# Patient Record
Sex: Female | Born: 1995 | Race: White | Hispanic: No | Marital: Single | Smoking: Never smoker
Health system: Southern US, Community
[De-identification: ages and names within clinical notes are randomized; demographics above are authoritative.]

## PROBLEM LIST (undated history)

## (undated) HISTORY — PX: APPENDECTOMY: SHX54

---
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
## 2016-10-30 ENCOUNTER — Encounter (HOSPITAL_COMMUNITY): Payer: Self-pay | Admitting: Emergency Medicine

## 2016-10-30 DIAGNOSIS — R55 Syncope and collapse: Secondary | ICD-10-CM | POA: Diagnosis not present

## 2016-10-30 DIAGNOSIS — N83201 Unspecified ovarian cyst, right side: Secondary | ICD-10-CM | POA: Insufficient documentation

## 2016-10-30 DIAGNOSIS — K353 Acute appendicitis with localized peritonitis: Secondary | ICD-10-CM | POA: Diagnosis not present

## 2016-10-30 DIAGNOSIS — R109 Unspecified abdominal pain: Secondary | ICD-10-CM | POA: Diagnosis present

## 2016-10-30 LAB — CBC
HCT: 35.6 % — ABNORMAL LOW (ref 36.0–46.0)
HEMOGLOBIN: 12 g/dL (ref 12.0–15.0)
MCH: 29.6 pg (ref 26.0–34.0)
MCHC: 33.7 g/dL (ref 30.0–36.0)
MCV: 87.9 fL (ref 78.0–100.0)
PLATELETS: 299 10*3/uL (ref 150–400)
RBC: 4.05 MIL/uL (ref 3.87–5.11)
RDW: 12.3 % (ref 11.5–15.5)
WBC: 17 10*3/uL — ABNORMAL HIGH (ref 4.0–10.5)

## 2016-10-30 NOTE — ED Triage Notes (Signed)
Pt c/o lower abd pain 10/10 with nausea and vomiting, pt states when pain gets very sharp she has some syncope episodes with the pain. No fever or chills.

## 2016-10-31 ENCOUNTER — Emergency Department (HOSPITAL_COMMUNITY): Payer: 59 | Admitting: Anesthesiology

## 2016-10-31 ENCOUNTER — Observation Stay (HOSPITAL_COMMUNITY)
Admission: EM | Admit: 2016-10-31 | Discharge: 2016-11-01 | Disposition: A | Discharge status: Home / Self Care | Payer: 59 | Attending: General Surgery | Admitting: General Surgery

## 2016-10-31 ENCOUNTER — Encounter (HOSPITAL_COMMUNITY): Payer: Self-pay | Admitting: Radiology

## 2016-10-31 ENCOUNTER — Encounter (HOSPITAL_COMMUNITY)
Admission: EM | Disposition: A | Discharge status: Home / Self Care | Payer: Self-pay | Source: Home / Self Care | Attending: Emergency Medicine

## 2016-10-31 ENCOUNTER — Emergency Department (HOSPITAL_COMMUNITY): Payer: 59

## 2016-10-31 DIAGNOSIS — K3533 Acute appendicitis with perforation and localized peritonitis, with abscess: Secondary | ICD-10-CM | POA: Diagnosis present

## 2016-10-31 DIAGNOSIS — N83209 Unspecified ovarian cyst, unspecified side: Secondary | ICD-10-CM

## 2016-10-31 DIAGNOSIS — K353 Acute appendicitis with localized peritonitis, without perforation or gangrene: Secondary | ICD-10-CM

## 2016-10-31 HISTORY — PX: LAPAROSCOPIC APPENDECTOMY: SHX408

## 2016-10-31 LAB — COMPREHENSIVE METABOLIC PANEL
ALK PHOS: 44 U/L (ref 38–126)
ALT: 19 U/L (ref 14–54)
AST: 25 U/L (ref 15–41)
Albumin: 4.3 g/dL (ref 3.5–5.0)
Anion gap: 10 (ref 5–15)
BUN: 14 mg/dL (ref 6–20)
CALCIUM: 9 mg/dL (ref 8.9–10.3)
CHLORIDE: 104 mmol/L (ref 101–111)
CO2: 22 mmol/L (ref 22–32)
CREATININE: 0.75 mg/dL (ref 0.44–1.00)
GFR calc Af Amer: >60 mL/min (ref >60)
GFR calc non Af Amer: >60 mL/min (ref >60)
GLUCOSE: 132 mg/dL — AB (ref 65–99)
Potassium: 3.4 mmol/L — ABNORMAL LOW (ref 3.5–5.1)
SODIUM: 136 mmol/L (ref 135–145)
Total Bilirubin: 0.4 mg/dL (ref 0.3–1.2)
Total Protein: 7 g/dL (ref 6.5–8.1)

## 2016-10-31 LAB — URINALYSIS, ROUTINE W REFLEX MICROSCOPIC
BACTERIA UA: NONE SEEN
Bilirubin Urine: NEGATIVE
GLUCOSE, UA: NEGATIVE mg/dL
HGB URINE DIPSTICK: NEGATIVE
Ketones, ur: 5 mg/dL — AB
Leukocytes, UA: NEGATIVE
Nitrite: NEGATIVE
PROTEIN: 30 mg/dL — AB
Specific Gravity, Urine: 1.031 — ABNORMAL HIGH (ref 1.005–1.030)
pH: 5 (ref 5.0–8.0)

## 2016-10-31 LAB — PREGNANCY, URINE: PREG TEST UR: NEGATIVE

## 2016-10-31 LAB — LIPASE, BLOOD: LIPASE: 28 U/L (ref 11–51)

## 2016-10-31 SURGERY — APPENDECTOMY, LAPAROSCOPIC
Anesthesia: General | Site: Abdomen

## 2016-10-31 MED ORDER — IOPAMIDOL (ISOVUE-300) INJECTION 61%
INTRAVENOUS | Status: AC
  Administered 2016-10-31: 100 mL
  Filled 2016-10-31: qty 30

## 2016-10-31 MED ORDER — HYDROMORPHONE HCL 1 MG/ML IJ SOLN
0.5000 mg | INTRAMUSCULAR | Status: DC | PRN
  Administered 2016-10-31: 1 mg via INTRAVENOUS

## 2016-10-31 MED ORDER — PROPOFOL 10 MG/ML IV BOLUS
INTRAVENOUS | Status: DC | PRN
  Administered 2016-10-31: 150 mg via INTRAVENOUS

## 2016-10-31 MED ORDER — SODIUM CHLORIDE 0.9 % IV SOLN
INTRAVENOUS | Status: DC | PRN
  Administered 2016-10-31: 10:00:00 via INTRAVENOUS

## 2016-10-31 MED ORDER — FENTANYL CITRATE (PF) 100 MCG/2ML IJ SOLN
25.0000 ug | INTRAMUSCULAR | Status: DC | PRN
  Administered 2016-10-31: 25 ug via INTRAVENOUS

## 2016-10-31 MED ORDER — SODIUM CHLORIDE 0.9 % IV BOLUS (SEPSIS)
1000.0000 mL | Freq: Once | INTRAVENOUS | Status: AC
  Administered 2016-10-31: 1000 mL via INTRAVENOUS

## 2016-10-31 MED ORDER — MORPHINE SULFATE (PF) 4 MG/ML IV SOLN
4.0000 mg | Freq: Once | INTRAVENOUS | Status: AC
  Administered 2016-10-31: 4 mg via INTRAVENOUS
  Filled 2016-10-31: qty 1

## 2016-10-31 MED ORDER — PROPOFOL 10 MG/ML IV BOLUS
INTRAVENOUS | Status: AC
  Filled 2016-10-31: qty 40

## 2016-10-31 MED ORDER — ONDANSETRON HCL 4 MG/2ML IJ SOLN
INTRAMUSCULAR | Status: DC | PRN
  Administered 2016-10-31: 4 mg via INTRAVENOUS

## 2016-10-31 MED ORDER — ONDANSETRON HCL 4 MG/2ML IJ SOLN
4.0000 mg | Freq: Four times a day (QID) | INTRAMUSCULAR | Status: DC | PRN
  Administered 2016-10-31: 4 mg via INTRAVENOUS
  Filled 2016-10-31: qty 2

## 2016-10-31 MED ORDER — HYDROMORPHONE HCL 1 MG/ML IJ SOLN
0.5000 mg | INTRAMUSCULAR | Status: DC | PRN
  Filled 2016-10-31: qty 1

## 2016-10-31 MED ORDER — LIDOCAINE 2% (20 MG/ML) 5 ML SYRINGE
INTRAMUSCULAR | Status: AC
  Filled 2016-10-31: qty 5

## 2016-10-31 MED ORDER — MIDAZOLAM HCL 2 MG/2ML IJ SOLN
INTRAMUSCULAR | Status: AC
  Filled 2016-10-31: qty 2

## 2016-10-31 MED ORDER — SUCCINYLCHOLINE CHLORIDE 200 MG/10ML IV SOSY
PREFILLED_SYRINGE | INTRAVENOUS | Status: AC
  Filled 2016-10-31: qty 10

## 2016-10-31 MED ORDER — KCL IN DEXTROSE-NACL 10-5-0.45 MEQ/L-%-% IV SOLN
INTRAVENOUS | Status: DC
  Administered 2016-10-31: 21:00:00 via INTRAVENOUS
  Filled 2016-10-31 (×4): qty 1000

## 2016-10-31 MED ORDER — ACETAMINOPHEN 650 MG RE SUPP: 650.0000 mg | Freq: Four times a day (QID) | RECTAL | Status: DC | PRN

## 2016-10-31 MED ORDER — SUGAMMADEX SODIUM 200 MG/2ML IV SOLN
INTRAVENOUS | Status: AC
  Filled 2016-10-31: qty 2

## 2016-10-31 MED ORDER — SUCCINYLCHOLINE CHLORIDE 20 MG/ML IJ SOLN
INTRAMUSCULAR | Status: DC | PRN
  Administered 2016-10-31: 100 mg via INTRAVENOUS

## 2016-10-31 MED ORDER — FENTANYL CITRATE (PF) 100 MCG/2ML IJ SOLN
INTRAMUSCULAR | Status: DC | PRN
  Administered 2016-10-31 (×5): 50 ug via INTRAVENOUS

## 2016-10-31 MED ORDER — FENTANYL CITRATE (PF) 250 MCG/5ML IJ SOLN
INTRAMUSCULAR | Status: AC
  Filled 2016-10-31: qty 5

## 2016-10-31 MED ORDER — ARTIFICIAL TEARS OPHTHALMIC OINT
TOPICAL_OINTMENT | OPHTHALMIC | Status: DC | PRN
  Administered 2016-10-31: 1 via OPHTHALMIC

## 2016-10-31 MED ORDER — ONDANSETRON HCL 4 MG/2ML IJ SOLN
4.0000 mg | Freq: Once | INTRAMUSCULAR | Status: AC
  Administered 2016-10-31: 4 mg via INTRAVENOUS
  Filled 2016-10-31: qty 2

## 2016-10-31 MED ORDER — ENOXAPARIN SODIUM 40 MG/0.4ML ~~LOC~~ SOLN
40.0000 mg | SUBCUTANEOUS | Status: DC
  Administered 2016-11-01: 40 mg via SUBCUTANEOUS
  Filled 2016-10-31: qty 0.4

## 2016-10-31 MED ORDER — BUPIVACAINE-EPINEPHRINE (PF) 0.25% -1:200000 IJ SOLN
INTRAMUSCULAR | Status: AC
  Filled 2016-10-31: qty 30

## 2016-10-31 MED ORDER — OXYCODONE HCL 5 MG PO TABS
5.0000 mg | ORAL_TABLET | ORAL | Status: DC | PRN
  Administered 2016-10-31: 5 mg via ORAL
  Administered 2016-11-01 (×2): 10 mg via ORAL
  Filled 2016-10-31: qty 2
  Filled 2016-10-31: qty 1
  Filled 2016-10-31: qty 2

## 2016-10-31 MED ORDER — HYDRALAZINE HCL 20 MG/ML IJ SOLN: 10.0000 mg | INTRAMUSCULAR | Status: DC | PRN

## 2016-10-31 MED ORDER — LIDOCAINE HCL (CARDIAC) 20 MG/ML IV SOLN
INTRAVENOUS | Status: DC | PRN
  Administered 2016-10-31: 100 mg via INTRAVENOUS

## 2016-10-31 MED ORDER — ROCURONIUM BROMIDE 10 MG/ML (PF) SYRINGE
PREFILLED_SYRINGE | INTRAVENOUS | Status: AC
  Filled 2016-10-31: qty 5

## 2016-10-31 MED ORDER — SODIUM CHLORIDE 0.9 % IR SOLN
Status: DC | PRN
  Administered 2016-10-31: 1000 mL

## 2016-10-31 MED ORDER — LACTATED RINGERS IV BOLUS (SEPSIS)
500.0000 mL | Freq: Once | INTRAVENOUS | Status: AC
  Administered 2016-10-31: 500 mL via INTRAVENOUS

## 2016-10-31 MED ORDER — ARTIFICIAL TEARS OPHTHALMIC OINT
TOPICAL_OINTMENT | OPHTHALMIC | Status: AC
  Filled 2016-10-31: qty 3.5

## 2016-10-31 MED ORDER — PIPERACILLIN-TAZOBACTAM 3.375 G IVPB 30 MIN
3.3750 g | Freq: Four times a day (QID) | INTRAVENOUS | Status: DC
  Administered 2016-10-31: 3.375 g via INTRAVENOUS
  Filled 2016-10-31: qty 50

## 2016-10-31 MED ORDER — FENTANYL CITRATE (PF) 100 MCG/2ML IJ SOLN
INTRAMUSCULAR | Status: AC
  Administered 2016-10-31: 25 ug via INTRAVENOUS
  Filled 2016-10-31: qty 2

## 2016-10-31 MED ORDER — ONDANSETRON 4 MG PO TBDP: 4.0000 mg | ORAL_TABLET | Freq: Four times a day (QID) | ORAL | Status: DC | PRN

## 2016-10-31 MED ORDER — ONDANSETRON HCL 4 MG/2ML IJ SOLN
INTRAMUSCULAR | Status: AC
  Filled 2016-10-31: qty 2

## 2016-10-31 MED ORDER — DEXAMETHASONE SODIUM PHOSPHATE 10 MG/ML IJ SOLN
INTRAMUSCULAR | Status: DC | PRN
  Administered 2016-10-31: 10 mg via INTRAVENOUS

## 2016-10-31 MED ORDER — BUPIVACAINE-EPINEPHRINE 0.25% -1:200000 IJ SOLN
INTRAMUSCULAR | Status: DC | PRN
  Administered 2016-10-31: 7 mL

## 2016-10-31 MED ORDER — ACETAMINOPHEN 325 MG PO TABS
650.0000 mg | ORAL_TABLET | Freq: Four times a day (QID) | ORAL | Status: DC | PRN
  Administered 2016-10-31 – 2016-11-01 (×2): 650 mg via ORAL
  Filled 2016-10-31 (×2): qty 2

## 2016-10-31 MED ORDER — IOPAMIDOL (ISOVUE-300) INJECTION 61%
INTRAVENOUS | Status: AC
  Filled 2016-10-31: qty 100

## 2016-10-31 MED ORDER — LACTATED RINGERS IV SOLN
INTRAVENOUS | Status: DC | PRN
  Administered 2016-10-31: 11:00:00 via INTRAVENOUS

## 2016-10-31 MED ORDER — SUGAMMADEX SODIUM 200 MG/2ML IV SOLN
INTRAVENOUS | Status: DC | PRN
Start: 2016-10-31 — End: 2016-10-31
  Administered 2016-10-31: 200 mg via INTRAVENOUS

## 2016-10-31 MED ORDER — 0.9 % SODIUM CHLORIDE (POUR BTL) OPTIME
TOPICAL | Status: DC | PRN
  Administered 2016-10-31: 1000 mL

## 2016-10-31 MED ORDER — PIPERACILLIN-TAZOBACTAM 3.375 G IVPB 30 MIN
3.3750 g | Freq: Once | INTRAVENOUS | Status: AC
  Administered 2016-10-31: 3.375 g via INTRAVENOUS
  Filled 2016-10-31: qty 50

## 2016-10-31 MED ORDER — MIDAZOLAM HCL 5 MG/5ML IJ SOLN
INTRAMUSCULAR | Status: DC | PRN
  Administered 2016-10-31: 2 mg via INTRAVENOUS

## 2016-10-31 MED ORDER — OXYCODONE HCL 5 MG PO TABS: 5.0000 mg | ORAL_TABLET | ORAL | Status: DC | PRN

## 2016-10-31 MED ORDER — PIPERACILLIN-TAZOBACTAM 3.375 G IVPB
3.3750 g | Freq: Three times a day (TID) | INTRAVENOUS | Status: DC
  Administered 2016-10-31 – 2016-11-01 (×3): 3.375 g via INTRAVENOUS
  Filled 2016-10-31 (×4): qty 50

## 2016-10-31 MED ORDER — DEXAMETHASONE SODIUM PHOSPHATE 10 MG/ML IJ SOLN
INTRAMUSCULAR | Status: AC
  Filled 2016-10-31: qty 1

## 2016-10-31 MED ORDER — ROCURONIUM BROMIDE 100 MG/10ML IV SOLN
INTRAVENOUS | Status: DC | PRN
  Administered 2016-10-31: 30 mg via INTRAVENOUS

## 2016-10-31 MED ORDER — PHENYLEPHRINE 40 MCG/ML (10ML) SYRINGE FOR IV PUSH (FOR BLOOD PRESSURE SUPPORT)
PREFILLED_SYRINGE | INTRAVENOUS | Status: AC
  Filled 2016-10-31: qty 10

## 2016-10-31 MED ORDER — DOCUSATE SODIUM 100 MG PO CAPS
100.0000 mg | ORAL_CAPSULE | Freq: Two times a day (BID) | ORAL | Status: DC
  Administered 2016-10-31 – 2016-11-01 (×2): 100 mg via ORAL
  Filled 2016-10-31 (×2): qty 1

## 2016-10-31 SURGICAL SUPPLY — 39 items
APPLIER CLIP ROT 10 11.4 M/L (STAPLE)
BLADE CLIPPER SURG (BLADE) IMPLANT
CANISTER SUCT 3000ML PPV (MISCELLANEOUS) ×2 IMPLANT
CHLORAPREP W/TINT 26ML (MISCELLANEOUS) ×2 IMPLANT
CLIP APPLIE ROT 10 11.4 M/L (STAPLE) IMPLANT
COVER SURGICAL LIGHT HANDLE (MISCELLANEOUS) ×2 IMPLANT
CUTTER FLEX LINEAR 45M (STAPLE) ×2 IMPLANT
DERMABOND ADVANCED (GAUZE/BANDAGES/DRESSINGS) ×1
DERMABOND ADVANCED .7 DNX12 (GAUZE/BANDAGES/DRESSINGS) ×1 IMPLANT
ELECT REM PT RETURN 9FT ADLT (ELECTROSURGICAL) ×2
ELECTRODE REM PT RTRN 9FT ADLT (ELECTROSURGICAL) ×1 IMPLANT
GLOVE BIO SURGEON STRL SZ8 (GLOVE) ×2 IMPLANT
GLOVE BIOGEL PI IND STRL 8 (GLOVE) ×1 IMPLANT
GLOVE BIOGEL PI INDICATOR 8 (GLOVE) ×1
GOWN STRL REUS W/ TWL LRG LVL3 (GOWN DISPOSABLE) IMPLANT
GOWN STRL REUS W/ TWL XL LVL3 (GOWN DISPOSABLE) ×2 IMPLANT
GOWN STRL REUS W/TWL LRG LVL3 (GOWN DISPOSABLE)
GOWN STRL REUS W/TWL XL LVL3 (GOWN DISPOSABLE) ×2
KIT BASIN OR (CUSTOM PROCEDURE TRAY) ×2 IMPLANT
KIT ROOM TURNOVER OR (KITS) ×2 IMPLANT
NEEDLE 22X1 1/2 (OR ONLY) (NEEDLE) ×2 IMPLANT
NS IRRIG 1000ML POUR BTL (IV SOLUTION) ×2 IMPLANT
PAD ARMBOARD 7.5X6 YLW CONV (MISCELLANEOUS) ×4 IMPLANT
POUCH SPECIMEN RETRIEVAL 10MM (ENDOMECHANICALS) ×2 IMPLANT
RELOAD 45 VASCULAR/THIN (ENDOMECHANICALS) IMPLANT
RELOAD STAPLE TA45 3.5 REG BLU (ENDOMECHANICALS) ×2 IMPLANT
SCISSORS LAP 5X35 DISP (ENDOMECHANICALS) ×2 IMPLANT
SET IRRIG TUBING LAPAROSCOPIC (IRRIGATION / IRRIGATOR) ×2 IMPLANT
SHEARS HARMONIC ACE PLUS 36CM (ENDOMECHANICALS) ×2 IMPLANT
SPECIMEN JAR SMALL (MISCELLANEOUS) ×2 IMPLANT
SUT VIC AB 4-0 PS2 27 (SUTURE) ×2 IMPLANT
TOWEL OR 17X24 6PK STRL BLUE (TOWEL DISPOSABLE) ×2 IMPLANT
TOWEL OR 17X26 10 PK STRL BLUE (TOWEL DISPOSABLE) ×2 IMPLANT
TRAY FOLEY CATH SILVER 16FR (SET/KITS/TRAYS/PACK) IMPLANT
TRAY LAPAROSCOPIC MC (CUSTOM PROCEDURE TRAY) ×2 IMPLANT
TROCAR XCEL 12X100 BLDLESS (ENDOMECHANICALS) ×2 IMPLANT
TROCAR XCEL BLUNT TIP 100MML (ENDOMECHANICALS) ×2 IMPLANT
TROCAR XCEL NON-BLD 5MMX100MML (ENDOMECHANICALS) ×2 IMPLANT
TUBING INSUFFLATION (TUBING) ×2 IMPLANT

## 2016-10-31 NOTE — ED Provider Notes (Signed)
  Physical Exam  BP 101/67   Pulse 90   Temp 98 F (36.7 C) (Oral)   Resp (!) 21   Ht 5\' 1"  (1.549 m)   Wt 54.4 kg (120 lb)   LMP 10/13/2016   SpO2 100%   BMI 22.67 kg/m   Physical Exam  ED Course  Procedures  MDM 6:21 AM- Sign out from Melburn HakeNicole Nadeau, [place]-C  Per previous provider MDM: Patient presents with sharp cramping abdominal pain with associated nausea and vomiting that started earlier this evening. Denies fever. Initial vitals revealed mild tachycardia, remaining vitals unremarkable, afebrile. Exam revealed tenderness over periumbilical and right lower quadrant, no peritoneal signs. Remaining exam unremarkable. Patient given IV fluids, pain meds and antiemetics. Pregnancy negative. WBC 17. UA with small amount of ketones, no signs of infection. Remaining labs unremarkable. Plan to order CT abdomen for further evaluation due to concern for appendicitis.  Pending CT Abdomen/Pelvis  8:16 AM- Spoke with Radiology. Perforated Appendicitis on distal aspect. Noted edema around right ovary. Could possibly be related to torsion, but likely appendicitis causing. Given ABX and consulted General Surgery  CT Abdomen/Pelvis IMPRESSION: Findings indicative of acute appendicitis. Moderate ascites raises suspicion for distal appendiceal rupture. No abscess or free air seen.  Right ovary appears edematous. Question edema secondary to inflammation from adjacent fluid, likely from the appendiceal inflammation an apparent rupture. Ovarian torsion could present in this manner but is felt to be less likely given the other findings.   Audry PiliMohr, Jannel Lynne, PA-C 10/31/16 Darrel [id number]    Pricilla LovelessGoldston, Scott, MD 11/03/16 [phone]

## 2016-10-31 NOTE — Anesthesia Procedure Notes (Signed)
Procedure Name: Intubation Date/Time: 10/31/2016 10:36 AM Performed by: Jacquiline Doe A Pre-anesthesia Checklist: Patient identified, Emergency Drugs available, Suction available and Patient being monitored Patient Re-evaluated:Patient Re-evaluated prior to inductionOxygen Delivery Method: Circle System Utilized and Circle system utilized Preoxygenation: Pre-oxygenation with 100% oxygen Intubation Type: IV induction, Rapid sequence and Cricoid Pressure applied Laryngoscope Size: Mac and 3 Grade View: Grade I Tube type: Oral Tube size: 7.5 mm Number of attempts: 1 Airway Equipment and Method: Stylet and Oral airway Placement Confirmation: ETT inserted through vocal cords under direct vision,  positive ETCO2 and breath sounds checked- equal and bilateral Secured at: 21 cm Tube secured with: Tape Dental Injury: Teeth and Oropharynx as per pre-operative assessment

## 2016-10-31 NOTE — Anesthesia Preprocedure Evaluation (Addendum)
Anesthesia Evaluation  Patient identified by MRN, date of birth, ID band Patient awake    Reviewed: Allergy & Precautions, NPO status , Patient's Chart, lab work & pertinent test results  Airway Mallampati: II  TM Distance: >3 FB     Dental   Pulmonary neg pulmonary ROS,    breath sounds clear to auscultation       Cardiovascular negative cardio ROS   Rhythm:Regular Rate:Normal     Neuro/Psych    GI/Hepatic Neg liver ROS, GI history noted. CG   Endo/Other  negative endocrine ROS  Renal/GU negative Renal ROS     Musculoskeletal   Abdominal   Peds  Hematology   Anesthesia Other Findings   Reproductive/Obstetrics                             Anesthesia Physical Anesthesia Plan  ASA: I and emergent  Anesthesia Plan: General   Post-op Pain Management:    Induction: Intravenous, Rapid sequence and Cricoid pressure planned  PONV Risk Score and Plan: 4 or greater and Ondansetron, Dexamethasone, Propofol, Midazolam, Scopolamine patch - Pre-op and Treatment may vary due to age or medical condition  Airway Management Planned: Oral ETT  Additional Equipment:   Intra-op Plan:   Post-operative Plan: Extubation in OR  Informed Consent: I have reviewed the patients History and Physical, chart, labs and discussed the procedure including the risks, benefits and alternatives for the proposed anesthesia with the patient or authorized representative who has indicated his/her understanding and acceptance.   Dental advisory given  Plan Discussed with: CRNA and Anesthesiologist  Anesthesia Plan Comments:        Anesthesia Quick Evaluation

## 2016-10-31 NOTE — Anesthesia Postprocedure Evaluation (Signed)
Anesthesia Post Note  Patient: Annette BirchwoodLauren Marshall  Procedure(s) Performed: Procedure(s) (LRB): APPENDECTOMY LAPAROSCOPIC AND CONTROL BLEEDING OF RUPTURED OVARIAN CYST (N/A)     Patient location during evaluation: PACU Anesthesia Type: General Level of consciousness: awake Pain management: pain level controlled Vital Signs Assessment: post-procedure vital signs reviewed and stable Respiratory status: spontaneous breathing Cardiovascular status: stable Anesthetic complications: no    Last Vitals:  Vitals:   10/31/16 0845 10/31/16 0900  BP: 113/73 107/76  Pulse: (!) 124 (!) 125  Resp:  18  Temp:      Last Pain:  Vitals:   10/31/16 0721  TempSrc:   PainSc: 3                  Dustina Scoggin

## 2016-10-31 NOTE — Transfer of Care (Signed)
Immediate Anesthesia Transfer of Care Note  Patient: Annette BirchwoodLauren Marshall  Procedure(s) Performed: Procedure(s): APPENDECTOMY LAPAROSCOPIC AND CONTROL BLEEDING OF RUPTURED OVARIAN CYST (N/A)  Patient Location: PACU  Anesthesia Type:General  Level of Consciousness: awake, oriented, sedated, patient cooperative and responds to stimulation  Airway & Oxygen Therapy: Patient Spontanous Breathing and Patient connected to nasal cannula oxygen  Post-op Assessment: Report given to RN, Post -op Vital signs reviewed and stable, Patient moving all extremities and Patient moving all extremities X 4  Post vital signs: Reviewed and stable  Last Vitals:  Vitals:   10/31/16 0845 10/31/16 0900  BP: 113/73 107/76  Pulse: (!) 124 (!) 125  Resp:  18  Temp:      Last Pain:  Vitals:   10/31/16 0721  TempSrc:   PainSc: 3       Patients Stated Pain Goal: 2 (10/31/16 0657)  Complications: No apparent anesthesia complications

## 2016-10-31 NOTE — H&P (Signed)
[facility] Admission Note  Annette Marshall 04-May-1996  [id number].    Requesting: Rikki Spearing Chief Complaint/Reason for Consult: Possible ruptured appendicitis HPI:  21 y.o. Female with onset of abdominal pain around 7 PM last night. Pain is sharp/stabbing 10/10, intermittent, and patient has associated n/v, diaphoresis and fainting. Pain is improved with remaining still, worsened with movement. Initially patient felt pain around and below umbilicus, now pain is worst in RLQ but has generalized discomfort as well. Patient also reports some urinary hesitancy and suprapubic discomfort with urination. Denies hematuria or flank pain. Patient denies chest pain, SOB, palpitations. Last meal was around 7PM last night.  Patient has no significant PMH and takes no daily medications. Surgical history significant for knee surgery. LMP started 10/13/16. Negative urine preg in ED.   ROS: Review of Systems  Constitutional: Positive for diaphoresis. Negative for chills and fever.  Eyes: Negative for blurred vision and double vision.  Respiratory: Negative for shortness of breath and wheezing.   Cardiovascular: Negative for chest pain and palpitations.  Gastrointestinal: Positive for abdominal pain, nausea and vomiting. Negative for blood in stool and diarrhea.  Genitourinary: Positive for dysuria (pressure/discomfort in suprapubic region).       Urinary hesitancy  Neurological: Positive for dizziness and loss of consciousness (thinks is related to pain). Negative for headaches.  All other systems reviewed and are negative.   History reviewed. No pertinent family history.  History reviewed. No pertinent past medical history.  Past Surgical History:  Procedure Laterality Date  . KNEE SURGERY  2011    Social History:  reports that she has never smoked. She has never used smokeless tobacco. She reports that she does not drink alcohol or use drugs.  Allergies: No Known  Allergies   (Not in a hospital admission)  Blood pressure 107/76, pulse (!) 125, temperature 98 F (36.7 C), temperature source Oral, resp. rate 18, height 5\' 1"  (1.549 m), weight 54.4 kg (120 lb), last menstrual period 10/13/2016, SpO2 100 %. Physical Exam: Physical Exam  Constitutional: She is oriented to person, place, and time. She appears well-developed and well-nourished. She is cooperative.  Non-toxic appearance. No distress.  HENT:  Head: Normocephalic and atraumatic.  Right Ear: External ear normal.  Left Ear: External ear normal.  Nose: Nose normal.  Mouth/Throat: Oropharynx is clear and moist and mucous membranes are normal.  Eyes: Conjunctivae, EOM and lids are normal. Pupils are equal, round, and reactive to light. No scleral icterus.  Neck: Trachea normal and normal range of motion. Neck supple. No tracheal deviation present.  Cardiovascular: S1 normal and S2 normal.  Tachycardia present.   Pulses:      Radial pulses are 2+ on the right side, and 2+ on the left side.       Dorsalis pedis pulses are 2+ on the right side, and 2+ on the left side.  Pulmonary/Chest: Effort normal and breath sounds normal. She has no wheezes. She has no rhonchi. She has no rales.  Abdominal: Soft. Normal appearance. She exhibits no shifting dullness and no distension. Bowel sounds are decreased. There is no hepatosplenomegaly. There is tenderness in the right lower quadrant, periumbilical area and suprapubic area. There is tenderness at McBurney's point. There is no rigidity, no rebound and no guarding. No hernia.  Musculoskeletal:  No gross deformities, no swelling or edema noted. Patient with normal AROM.  Neurological: She is alert and oriented to person, place, and time. She has normal strength.  Skin: Skin is warm and  dry. Capillary refill takes less than 2 seconds. No rash noted. She is not diaphoretic. No pallor.  Psychiatric: She has a normal mood and affect. Her behavior is normal.  Thought content normal.    Results for orders placed or performed during the hospital encounter of 10/31/16 (from the past 48 hour(s))  Urinalysis, Routine w reflex microscopic     Status: Abnormal   Collection Time: 10/30/16 11:40 AM  Result Value Ref Range   Color, Urine AMBER (A) YELLOW    Comment: BIOCHEMICALS MAY BE AFFECTED BY COLOR   APPearance HAZY (A) CLEAR   Specific Gravity, Urine 1.031 (H) 1.005 - 1.030   pH 5.0 5.0 - 8.0   Glucose, UA NEGATIVE NEGATIVE mg/dL   Hgb urine dipstick NEGATIVE NEGATIVE   Bilirubin Urine NEGATIVE NEGATIVE   Ketones, ur 5 (A) NEGATIVE mg/dL   Protein, ur 30 (A) NEGATIVE mg/dL   Nitrite NEGATIVE NEGATIVE   Leukocytes, UA NEGATIVE NEGATIVE   RBC / HPF 0-5 0 - 5 RBC/hpf   WBC, UA 0-5 0 - 5 WBC/hpf   Bacteria, UA NONE SEEN NONE SEEN   Squamous Epithelial / LPF 0-5 (A) NONE SEEN   Mucous PRESENT    Hyaline Casts, UA PRESENT   Lipase, blood     Status: None   Collection Time: 10/30/16 11:21 PM  Result Value Ref Range   Lipase 28 11 - 51 U/L  Comprehensive metabolic panel     Status: Abnormal   Collection Time: 10/30/16 11:21 PM  Result Value Ref Range   Sodium 136 135 - 145 mmol/L   Potassium 3.4 (L) 3.5 - 5.1 mmol/L   Chloride 104 101 - 111 mmol/L   CO2 22 22 - 32 mmol/L   Glucose, Bld 132 (H) 65 - 99 mg/dL   BUN 14 6 - 20 mg/dL   Creatinine, Ser 0.75 0.44 - 1.00 mg/dL   Calcium 9.0 8.9 - 10.3 mg/dL   Total Protein 7.0 6.5 - 8.1 g/dL   Albumin 4.3 3.5 - 5.0 g/dL   AST 25 15 - 41 U/L   ALT 19 14 - 54 U/L   Alkaline Phosphatase 44 38 - 126 U/L   Total Bilirubin 0.4 0.3 - 1.2 mg/dL   GFR calc non Af Amer >60 >60 mL/min   GFR calc Af Amer >60 >60 mL/min    Comment: (NOTE) The eGFR has been calculated using the CKD EPI equation. This calculation has not been validated in all clinical situations. eGFR's persistently <60 mL/min signify possible Chronic Kidney Disease.    Anion gap 10 5 - 15  CBC     Status: Abnormal   Collection Time:  10/30/16 11:21 PM  Result Value Ref Range   WBC 17.0 (H) 4.0 - 10.5 K/uL   RBC 4.05 3.87 - 5.11 MIL/uL   Hemoglobin 12.0 12.0 - 15.0 g/dL   HCT 35.6 (L) 36.0 - 46.0 %   MCV 87.9 78.0 - 100.0 fL   MCH 29.6 26.0 - 34.0 pg   MCHC 33.7 30.0 - 36.0 g/dL   RDW 12.3 11.5 - 15.5 %   Platelets 299 150 - 400 K/uL  Pregnancy, urine     Status: None   Collection Time: 10/30/16 11:34 PM  Result Value Ref Range   Preg Test, Ur NEGATIVE NEGATIVE    Comment:        THE SENSITIVITY OF THIS METHODOLOGY IS >20 mIU/mL.    Ct Abdomen Pelvis W Contrast  Result Date: 10/31/2016  CLINICAL DATA:  Abdominal pain with vomiting EXAM: CT ABDOMEN AND PELVIS WITH CONTRAST TECHNIQUE: Multidetector CT imaging of the abdomen and pelvis was performed using the standard protocol following bolus administration of intravenous contrast. Oral contrast was also administered. CONTRAST:  160m ISOVUE-300 IOPAMIDOL (ISOVUE-300) INJECTION 61% COMPARISON:  None. FINDINGS: Lower chest: Lung bases are clear. Hepatobiliary: There is a 7 mm cyst in the posterior segment of the right lobe of the liver. No other focal liver lesions are evident. Gallbladder wall is not appreciably thickened. There is no biliary duct dilatation. Pancreas: There is no pancreatic mass or inflammatory focus. Spleen: No splenic lesions are evident. Adrenals/Urinary Tract: Adrenals appear normal bilaterally. Kidneys bilaterally show no mass or hydronephrosis on either side. There is no renal or ureteral calculus on either side. Urinary bladder is midline with wall thickness within normal limits. Stomach/Bowel: There is no bowel wall or mesenteric thickening. No bowel obstruction. No free air or portal venous air. Vascular/Lymphatic: There is no abdominal aortic aneurysm. No vascular lesions are evident. There are multiple lymph nodes in the right abdomen, largest measuring 1.4 x 0.9 cm in size. Elsewhere, there is no appreciable lymph node prominence. Reproductive:  Uterus is anteverted. There is a probable follicle in the right ovary measuring 1.9 x 1.4 cm. No other pelvic masses appreciable. Other: There is diffuse wall thickening in the appendix consistent with acute appendicitis. There is moderate ascites throughout the abdomen and pelvis which raises question of appendiceal rupture. No abscess or free air seen, however. Musculoskeletal: There are no blastic or lytic bone lesions. 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 No intramuscular or abdominal wall lesions. IMPRESSION: Findings indicative of acute appendicitis. Moderate ascites raises suspicion for distal appendiceal rupture. No abscess or free air seen. Right ovary appears edematous. Question edema secondary to inflammation from adjacent fluid, likely from the appendiceal inflammation an apparent rupture. Ovarian torsion could present in this manner but is felt to be less likely given the other findings. No bowel obstruction. No abscess elsewhere in the abdomen or pelvis. No renal or ureteral calculus.  No hydronephrosis. 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 Critical Value/emergent results were called by telephone at the time of interpretation on 10/31/2016 at 8:16 am to TShary Decamp P[place] who verbally acknowledged these results. Electronically Signed   By: WLowella GripIII M.D.   On: 10/31/2016 08:16      Assessment/Plan Acute appendicitis with peritonitis Leukocytosis - OR today for laparoscopic appendectomy - discussed risks of procedure and answered questions - consent, IV Zosyn - NPO, IVF, pain control - Admit to med-surg postoperatively   [facility]Brigid [facility] 10/31/2016, 9:31 AM Pager: [phone]: [phone]-Fri 7:00 am-4:30 pm Sat-Sun 7:00 am-11:30 am

## 2016-10-31 NOTE — ED Notes (Signed)
PA at bedside,  

## 2016-10-31 NOTE — ED Provider Notes (Signed)
[facility]-EMERGENCY DEPT Provider Note   CSN: [id number] Arrival date & time: 10/30/16  2251     History   Chief Complaint Chief Complaint  Patient presents with  . Abdominal Pain  . Nausea  . Emesis    HPI Annette Marshall is a 21 y.o. female.  HPI   Patient is a 21 year old female with no pertinent past medical history presents the ED with complaint of abdominal pain, onset 7 PM. Patient reports while she was sitting resting on home she began to have sharp cramping pain to the right side of her abdomen. She notes the pain was worse with movement. She also reports having a deep dull aching pain to her upper abdomen. Endorses associated nausea and NBNB vomiting. She reports during the night she has had flares where her pain significantly worsens resulting in her having near syncopal episodes due to the pain. She reports having similar episodes in the past when she has had blood drawn. Denies LOC. She reports taking 2 ibuprofen at home with mild intermittent relief of pain. Denies fever, chills, chest pain, shortness of breath, hematemesis, diarrhea, constipation, urinary symptoms, flank pain, vaginal bleeding or discharge. Patient denies history of similar symptoms in the past. Denies any history of abdominal surgeries. LMP 10/13/16.   History reviewed. No pertinent past medical history.  There are no active problems to display for this patient.   Past Surgical History:  Procedure Laterality Date  . KNEE SURGERY  2011    OB History    No data available       Home Medications    Prior to Admission medications   Not on File    Family History History reviewed. No pertinent family history.  Social History Social History  Substance Use Topics  . Smoking status: Never Smoker  . Smokeless tobacco: Never Used  . Alcohol use No     Allergies   Patient has no known allergies.   Review of Systems Review of Systems  Gastrointestinal: Positive for abdominal pain, nausea  and vomiting.  All other systems reviewed and are negative.    Physical Exam Updated Vital Signs BP (!) 99/55   Pulse 90   Temp 98 F (36.7 C) (Oral)   Resp 16   Ht 5\' 1"  (1.549 m)   Wt 54.4 kg (120 lb)   LMP 10/13/2016   SpO2 100%   BMI 22.67 kg/m   Physical Exam  Constitutional: She is oriented to person, place, and time. She appears well-developed and well-nourished.  HENT:  Head: Normocephalic and atraumatic.  Mouth/Throat: Oropharynx is clear and moist. No oropharyngeal exudate.  Eyes: Conjunctivae and EOM are normal. Right eye exhibits no discharge. Left eye exhibits no discharge. No scleral icterus.  Neck: Normal range of motion. Neck supple.  Cardiovascular: Regular rhythm, normal heart sounds and intact distal pulses.   Mildly tachycardic, HR 107  Pulmonary/Chest: Effort normal and breath sounds normal. No respiratory distress. She has no wheezes. She has no rales. She exhibits no tenderness.  Abdominal: Soft. Normal appearance and bowel sounds are normal. She exhibits no distension and no mass. There is tenderness in the right lower quadrant and periumbilical area. There is no rigidity, no rebound, no guarding, no CVA tenderness, no tenderness at McBurney's point and negative Murphy's sign. No hernia.  TTP over periumbilical region and RLQ.   Musculoskeletal: Normal range of motion. She exhibits no edema.  Neurological: She is alert and oriented to person, place, and time.  Skin: Skin is  warm and dry.  Nursing note and vitals reviewed.    ED Treatments / Results  Labs (all labs ordered are listed, but only abnormal results are displayed) Labs Reviewed  COMPREHENSIVE METABOLIC PANEL - Abnormal; Notable for the following:       Result Value   Potassium 3.4 (*)    Glucose, Bld 132 (*)    All other components within normal limits  CBC - Abnormal; Notable for the following:    WBC 17.0 (*)    HCT 35.6 (*)    All other components within normal limits    URINALYSIS, ROUTINE W REFLEX MICROSCOPIC - Abnormal; Notable for the following:    Color, Urine AMBER (*)    APPearance HAZY (*)    Specific Gravity, Urine 1.031 (*)    Ketones, ur 5 (*)    Protein, ur 30 (*)    Squamous Epithelial / LPF 0-5 (*)    All other components within normal limits  LIPASE, BLOOD  PREGNANCY, URINE  POC URINE PREG, ED    EKG  EKG Interpretation None       Radiology No results found.  Procedures Procedures (including critical care time)  Medications Ordered in ED Medications  sodium chloride 0.9 % bolus 1,000 mL (not administered)  ondansetron (ZOFRAN) injection 4 mg (not administered)  morphine 4 MG/ML injection 4 mg (not administered)     Initial Impression / Assessment and Plan / ED Course  I have reviewed the triage vital signs and the nursing notes.  Pertinent labs & imaging results that were available during my care of the patient were reviewed by me and considered in my medical decision making (see chart for details).    Patient presents with sharp cramping abdominal pain with associated nausea and vomiting that started earlier this evening. Denies fever. Initial vitals revealed mild tachycardia, remaining vitals unremarkable, afebrile. Exam revealed tenderness over periumbilical and right lower quadrant, no peritoneal signs. Remaining exam unremarkable. Patient given IV fluids, pain meds and antiemetics. Pregnancy negative. WBC 17. UA with small amount of ketones, no signs of infection. Remaining labs unremarkable. Plan to order CT abdomen for further evaluation due to concern for appendicitis.  Hand-off to FirstEnergy Corpyle Mohr, PA-C. Dispo pending CT abd/pelvis.  Final Clinical Impressions(s) / ED Diagnoses   Final diagnoses:  None    New Prescriptions New Prescriptions   No medications on file     Barrett Henleadeau, Delvin Hedeen Elizabeth, Cordelia Poche-C 10/31/16 Dorothy [id number]    Goldston, Scott, MD 11/03/16 [phone]

## 2016-10-31 NOTE — Op Note (Signed)
10/31/2016  11:43 AM  PATIENT:  Annette BirchwoodLauren Marshall  21 y.o. female  PRE-OPERATIVE DIAGNOSIS:  appendicitis  POST-OPERATIVE DIAGNOSIS:  Ruptured right ovarian cyst with hemorrhage  PROCEDURE:  Procedure(s): APPENDECTOMY LAPAROSCOPIC CONTROL BLEEDING OF RUPTURED OVARIAN CYST  SURGEON:  Surgeon(s): Violeta Gelinashompson, Jceon Alverio, MD  ASSISTANTS: none   ANESTHESIA:   local and general  EBL:  Total I/O In: 1000 [I.V.:1000] Out: 25 [Blood:25]  BLOOD ADMINISTERED:none  DRAINS: none   SPECIMEN:  Excision  DISPOSITION OF SPECIMEN:  PATHOLOGY  COUNTS:  YES  DICTATION: .Dragon Dictation Findings: Moderate hemoperitoneum with ruptured right ovarian cyst, mild secondary inflammation of the appendix  Procedure in detail: Informed consent was obtained. She received intravenous antibiotics. She was brought to the operating room and general endotracheal anesthesia was administered by the anesthesia staff. Her abdomen was prepped and draped in a sterile fashion. We did a time out procedure. The infraumbilical region was infiltrated with local. Infraumbilical incision was made. Subcutaneous tissues were dissected down revealing the anterior fascia. This was divided sharply along the midline. Peritoneal cavity was entered under direct vision without complication. A 0 Vicryl pursestring was placed around the fascial opening. Hassan trocar was inserted into the abdomen. The abdomen was insufflated with carbon dioxide in standard fashion. Under direct vision a 12 mm left lower quadrant and 5 mm right mid abdomen port were placed. Local was used at each port site. Laparoscopic exploration revealed hemoperitoneum in the pelvis and above the liver. This was evacuated with suction. The appendix was minimally inflamed. The mesoappendix was divided with the harmonic scalpel. The base of the appendix was divided with Endo GIA and it was removed. There was excellent closure on the staple line. No further bleeding was noted  from there. Next, the adnexa was explored. She had evidence of a ruptured right ovarian cyst with some bleeding along the cyst wall. Bleeding along there was controlled with the harmonic can gentle cautery. Left ovary was large but had no abnormalities. The right ovary was rechecked and there was no further bleeding. Abdomen was irrigated with saline irrigation returned and the right ovary was rechecked. No bleeding. Staple line remained intact on the cecum. Ports removed under direct vision. Pneumoperitoneum was released. Infraumbilical fascia was closed by tying the pursestring. All 3 wounds were copiously irrigated and closed with 4-0 Vicryl subcuticular followed by Dermabond. All counts were correct. She tolerated procedure well without apparent complications and was taken to recovery in stable condition.  PATIENT DISPOSITION:  PACU - hemodynamically stable.   Delay start of Pharmacological VTE agent (>24hrs) due to surgical blood loss or risk of bleeding:  no  Violeta GelinasBurke Iona Stay, MD, MPH, FACS Pager: [phone]  6/10/201811:43 AM

## 2016-11-01 ENCOUNTER — Encounter (HOSPITAL_COMMUNITY): Payer: Self-pay | Admitting: General Surgery

## 2016-11-01 DIAGNOSIS — N83209 Unspecified ovarian cyst, unspecified side: Secondary | ICD-10-CM

## 2016-11-01 LAB — CBC
HEMATOCRIT: 27.2 % — AB (ref 36.0–46.0)
HEMOGLOBIN: 9 g/dL — AB (ref 12.0–15.0)
MCH: 29.2 pg (ref 26.0–34.0)
MCHC: 32.7 g/dL (ref 30.0–36.0)
MCV: 89.2 fL (ref 78.0–100.0)
Platelets: 245 10*3/uL (ref 150–400)
RBC: 3.05 MIL/uL — AB (ref 3.87–5.11)
RDW: 12.9 % (ref 11.5–15.5)
WBC: 9.5 10*3/uL (ref 4.0–10.5)

## 2016-11-01 LAB — BASIC METABOLIC PANEL
ANION GAP: 5 (ref 5–15)
BUN: 5 mg/dL — ABNORMAL LOW (ref 6–20)
CHLORIDE: 107 mmol/L (ref 101–111)
CO2: 24 mmol/L (ref 22–32)
Calcium: 8.3 mg/dL — ABNORMAL LOW (ref 8.9–10.3)
Creatinine, Ser: 0.62 mg/dL (ref 0.44–1.00)
GFR calc non Af Amer: >60 mL/min (ref >60)
Glucose, Bld: 116 mg/dL — ABNORMAL HIGH (ref 65–99)
POTASSIUM: 3.9 mmol/L (ref 3.5–5.1)
Sodium: 136 mmol/L (ref 135–145)

## 2016-11-01 MED ORDER — OXYCODONE HCL 5 MG PO TABS: 5.0000 mg | ORAL_TABLET | ORAL | 0 refills | Status: AC | PRN

## 2016-11-01 NOTE — Progress Notes (Signed)
Discharge paperwork given to patient. No questions verbalized. Patient is ready for discharge. 

## 2016-11-01 NOTE — Discharge Instructions (Signed)
Please arrive at least 30 min before your appointment to complete your check in paperwork.  If you are unable to arrive 30 min prior to your appointment time we may have to cancel or reschedule you.  LAPAROSCOPIC SURGERY: POST OP INSTRUCTIONS  1. DIET: Follow a light bland diet the first 24 hours after arrival home, such as soup, liquids, crackers, etc. Be sure to include lots of fluids daily. Avoid fast food or heavy meals as your are more likely to get nauseated. Eat a low fat the next few days after surgery.  2. Take your usually prescribed home medications unless otherwise directed. 3. PAIN CONTROL:  1. Pain is best controlled by a usual combination of three different methods TOGETHER:  1. Ice/Heat 2. Over the counter pain medication 3. Prescription pain medication 2. Most patients will experience some swelling and bruising around the incisions. Ice packs or heating pads (30-60 minutes up to 6 times a day) will help. Use ice for the first few days to help decrease swelling and bruising, then switch to heat to help relax tight/sore spots and speed recovery. Some people prefer to use ice alone, heat alone, alternating between ice & heat. Experiment to what works for you. Swelling and bruising can take several weeks to resolve.  3. It is helpful to take an over-the-counter pain medication regularly for the first few weeks. Choose one of the following that works best for you:  1. Naproxen (Aleve, etc) Two 220mg  tabs twice a day 2. Ibuprofen (Advil, etc) Three 200mg  tabs four times a day (every meal & bedtime) 3. Acetaminophen (Tylenol, etc) 500-650mg  four times a day (every meal & bedtime) 4. A prescription for pain medication (such as oxycodone, hydrocodone, etc) should be given to you upon discharge. Take your pain medication as prescribed.  1. If you are having problems/concerns with the prescription medicine (does not control pain, nausea, vomiting, rash, itching, etc), please call us [phone]  [phone] to see if we need to switch you to a different pain medicine that will work better for you and/or control your side effect better. 2. If you need a refill on your pain medication, please contact your pharmacy. They will contact our office to request authorization. Prescriptions will not be filled after 5 pm or on week-ends. 4. Avoid getting constipated. Between the surgery and the pain medications, it is common to experience some constipation. Increasing fluid intake and taking a fiber supplement (such as Metamucil, Citrucel, FiberCon, MiraLax, etc) 1-2 times a day regularly along with a stool softener (Colace) daily will usually help prevent this problem from occurring. A mild laxative (prune juice, Milk of Magnesia, doloculax, etc) should be taken according to package directions if there are no bowel movements after 48 hours.  5. Watch out for diarrhea. If you have many loose bowel movements, simplify your diet to bland foods & liquids for a few days. Stop any stool softeners and decrease your fiber supplement. Switching to mild anti-diarrheal medications (Kayopectate, Pepto Bismol) can help. If this worsens or does not improve, please call us. 6. Wash / shower every day. You may shower over the dressings as they are waterproof. Continue to shower over incision(s) after the dressing is off. 7. Remove your waterproof bandages 5 days after surgery. You may leave the incision open to air. You may replace a dressing/Band-Aid to cover the incision for comfort if you wish.  8. ACTIVITIES as tolerated:  1. You may resume regular (light) daily activities beginning the next  day--such as daily self-care, walking, climbing stairs--gradually increasing activities as tolerated. If you can walk 30 minutes without difficulty, it is safe to try more intense activity such as jogging, treadmill, bicycling, low-impact aerobics, swimming, etc. 2. Save the most intensive and strenuous activity for last such as  sit-ups, heavy lifting, contact sports, etc Refrain from any heavy lifting or straining until you are off narcotics for pain control.  3. DO NOT PUSH THROUGH PAIN. Let pain be your guide: If it hurts to do something, don't do it. Pain is your body warning you to avoid that activity for another week until the pain goes down. 4. You may drive when you are no longer taking prescription pain medication, you can comfortably wear a seatbelt, and you can safely maneuver your car and apply brakes. 5. You may have sexual intercourse when it is comfortable.  9. FOLLOW UP in our office  1. Please call [facility] at [phone] to set up an appointment to see your surgeon in the office for a follow-up appointment approximately 2-3 weeks after your surgery. 2. Make sure that you call for this appointment the day you arrive home to insure a convenient appointment time.      10. IF YOU HAVE DISABILITY OR FAMILY LEAVE FORMS, BRING THEM TO THE               OFFICE FOR PROCESSING.   WHEN TO CALL US [phone](336) [phone]:  1. Poor pain control 2. Reactions / problems with new medications (rash/itching, nausea, etc)  3. Fever over 101.5 F (38.5 C) 4. Inability to urinate 5. Nausea and/or vomiting 6. Worsening swelling or bruising 7. Continued bleeding from incision. 8. Increased pain, redness, or drainage from the incision  The clinic staff is available to answer your questions during regular business hours (8:30am-5pm). Please dont hesitate to call and ask to speak to one of our nurses for clinical concerns.  If you have a medical emergency, go to the nearest emergency room or call 911.  A surgeon from [facility] is always on call at the [facility]   [facility], [place]  [address], [address], [place], [place] [address] ?  MAIN: [phone] ? TOLL FREE: [phone] ?  FAX [phone]  [email or web address]   Ovarian Cyst An ovarian cyst is a fluid-filled sac that  forms on an ovary. The ovaries are small organs that produce eggs in women. Various types of cysts can form on the ovaries. Some may cause symptoms and require treatment. Most ovarian cysts go away on their own, are not cancerous (are benign), and do not cause problems. Common types of ovarian cysts include:  Functional (follicle) cysts. ? Occur during the menstrual cycle, and usually go away with the next menstrual cycle if you do not get pregnant. ? Usually cause no symptoms.  Endometriomas. ? Are cysts that form from the tissue that lines the uterus (endometrium). ? Are sometimes called chocolate cysts because they become filled with blood that turns brown. ? Can cause pain in the lower abdomen during intercourse and during your period.  Cystadenoma cysts. ? Develop from cells on the outside surface of the ovary. ? Can get very large and cause lower abdomen pain and pain with intercourse. ? Can cause severe pain if they twist or break open (rupture).  Dermoid cysts. ? Are sometimes found in both ovaries. ? May contain different kinds of body tissue, such as skin, teeth, hair, or cartilage. ? Usually  do not cause symptoms unless they get very big.  Theca lutein cysts. ? Occur when too much of a certain hormone (human chorionic gonadotropin) is produced and overstimulates the ovaries to produce an egg. ? Are most common after having procedures used to assist with the conception of a baby (in vitro fertilization).  What are the causes? Ovarian cysts may be caused by:  Ovarian hyperstimulation syndrome. This is a condition that can develop from taking fertility medicines. It causes multiple large ovarian cysts to form.  Polycystic ovarian syndrome (PCOS). This is a common hormonal disorder that can cause ovarian cysts, as well as problems with your period or fertility.  What increases the risk? The following factors may make you more likely to develop ovarian cysts:  Being  overweight or obese.  Taking fertility medicines.  Taking certain forms of hormonal birth control.  Smoking.  What are the signs or symptoms? Many ovarian cysts do not cause symptoms. If symptoms are present, they may include:  Pelvic pain or pressure.  Pain in the lower abdomen.  Pain during sex.  Abdominal swelling.  Abnormal menstrual periods.  Increasing pain with menstrual periods.  How is this diagnosed? These cysts are commonly found during a routine pelvic exam. You may have tests to find out more about the cyst, such as:  Ultrasound.  X-ray of the pelvis.  CT scan.  MRI.  Blood tests.  How is this treated? Many ovarian cysts go away on their own without treatment. Your health care provider may want to check your cyst regularly for 2-3 months to see if it changes. If you are in menopause, it is especially important to have your cyst monitored closely because menopausal women have a higher rate of ovarian cancer. When treatment is needed, it may include:  Medicines to help relieve pain.  A procedure to drain the cyst (aspiration).  Surgery to remove the whole cyst.  Hormone treatment or birth control pills. These methods are sometimes used to help dissolve a cyst.  Follow these instructions at home:  Take over-the-counter and prescription medicines only as told by your health care provider.  Do not drive or use heavy machinery while taking prescription pain medicine.  Get regular pelvic exams and Pap tests as often as told by your health care provider.  Return to your normal activities as told by your health care provider. Ask your health care provider what activities are safe for you.  Do not use any products that contain nicotine or tobacco, such as cigarettes and e-cigarettes. If you need help quitting, ask your health care provider.  Keep all follow-up visits as told by your health care provider. This is important. Contact a health care provider  if:  Your periods are late, irregular, or painful, or they stop.  You have pelvic pain that does not go away.  You have pressure on your bladder or trouble emptying your bladder completely.  You have pain during sex.  You have any of the following in your abdomen: ? A feeling of fullness. ? Pressure. ? Discomfort. ? Pain that does not go away. ? Swelling.  You feel generally ill.  You become constipated.  You lose your appetite.  You develop severe acne.  You start to have more body hair and facial hair.  You are gaining weight or losing weight without changing your exercise and eating habits.  You think you may be pregnant. Get help right away if:  You have abdominal pain that is  severe or gets worse.  You cannot eat or drink without vomiting.  You suddenly develop a fever.  Your menstrual period is much heavier than usual. This information is not intended to replace advice given to you by your health care provider. Make sure you discuss any questions you have with your health care provider. Document Released: 05/10/2005 Document Revised: 11/28/2015 Document Reviewed: 10/12/2015 Elsevier Interactive Patient Education  [employer].

## 2016-11-01 NOTE — Discharge Summary (Signed)
[facility] Discharge Summary   Patient ID: Annette Marshall MRN: [id number] DOB/AGE: Oct 16, 1995 21 y.o.  Admit date: 10/31/2016 Discharge date: 11/01/2016  Admitting Diagnosis: Appendicitis Ruptured right ovarian cyst with hemorrhage  Discharge Diagnosis Patient Active Problem List   Diagnosis Date Noted  . Ruptured ovarian cyst 11/01/2016  . Acute appendicitis with peritonitis 10/31/2016    Consultants none  Imaging: Ct Abdomen Pelvis W Contrast  Result Date: 10/31/2016 CLINICAL DATA:  Abdominal pain with vomiting EXAM: CT ABDOMEN AND PELVIS WITH CONTRAST TECHNIQUE: Multidetector CT imaging of the abdomen and pelvis was performed using the standard protocol following bolus administration of intravenous contrast. Oral contrast was also administered. CONTRAST:  100mL ISOVUE-300 IOPAMIDOL (ISOVUE-300) INJECTION 61% COMPARISON:  None. FINDINGS: Lower chest: Lung bases are clear. Hepatobiliary: There is a 7 mm cyst in the posterior segment of the right lobe of the liver. No other focal liver lesions are evident. Gallbladder wall is not appreciably thickened. There is no biliary duct dilatation. Pancreas: There is no pancreatic mass or inflammatory focus. Spleen: No splenic lesions are evident. Adrenals/Urinary Tract: Adrenals appear normal bilaterally. Kidneys bilaterally show no mass or hydronephrosis on either side. There is no renal or ureteral calculus on either side. Urinary bladder is midline with wall thickness within normal limits. Stomach/Bowel: There is no bowel wall or mesenteric thickening. No bowel obstruction. No free air or portal venous air. Vascular/Lymphatic: There is no abdominal aortic aneurysm. No vascular lesions are evident. There are multiple lymph nodes in the right abdomen, largest measuring 1.4 x 0.9 cm in size. Elsewhere, there is no appreciable lymph node prominence. Reproductive: Uterus is anteverted. There is a probable follicle in the right  ovary measuring 1.9 x 1.4 cm. No other pelvic masses appreciable. Other: There is diffuse wall thickening in the appendix consistent with acute appendicitis. There is moderate ascites throughout the abdomen and pelvis which raises question of appendiceal rupture. No abscess or free air seen, however. Musculoskeletal: There are no blastic or lytic bone lesions. 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 No intramuscular or abdominal wall lesions. IMPRESSION: Findings indicative of acute appendicitis. Moderate ascites raises suspicion for distal appendiceal rupture. No abscess or free air seen. Right ovary appears edematous. Question edema secondary to inflammation from adjacent fluid, likely from the appendiceal inflammation an apparent rupture. Ovarian torsion could present in this manner but is felt to be less likely given the other findings. No bowel obstruction. No abscess elsewhere in the abdomen or pelvis. No renal or ureteral calculus.  No hydronephrosis. 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 Critical Value/emergent results were called by telephone at the time of interpretation on 10/31/2016 at 8:16 am to Audry Piliyler Mohr, PA, who verbally acknowledged these results. Electronically Signed   By: Bretta BangWilliam  Woodruff III M.D.   On: 10/31/2016 08:16    Procedures Dr. Janee Mornhompson (10/31/16) - Laparoscopic Appendectomy, control bleeding of ruptured ovarian cyst  Hospital Course:  Annette BirchwoodLauren Caddell is a 21 year old female who [facility].  Workup showed appendicitis with peritonitis.  Patient was admitted and underwent procedure listed above.  Tolerated procedure well and was transferred to the floor.  Diet was advanced as tolerated.  On POD#1, the patient was voiding well, tolerating diet, ambulating well, pain well controlled, vital signs stable, incisions c/d/i and felt stable for discharge home.  Patient will follow up in  our office in 2 weeks and knows to call with questions or concerns.  She will call to set up appointment.    Patient was discharged in good condition.  The [place] Substance controlled database was reviewed prior to prescribing narcotic pain medication to this patient.  Physical Exam: General:  Alert, NAD, pleasant, cooperative, well appearing Cardio: mildly tachycardic, regular rhythm, S1 & S2 normal, no murmur, rubs, gallops Resp: Effort normal, lungs CTA bilaterally, no wheezes, rales, rhonchi Abd:  Soft, ND, +BS, mild generalized tenderness worse around port sites, incisions C/D/I Skin: warm and dry   Allergies as of 11/01/2016   No Known Allergies     Medication List    TAKE these medications   ibuprofen 200 MG tablet Commonly known as:  ADVIL,MOTRIN Take 200 mg by mouth every 6 (six) hours as needed for mild pain.   oxyCODONE 5 MG immediate release tablet Commonly known as:  Oxy IR/ROXICODONE Take 1 tablet (5 mg total) by mouth every 4 (four) hours as needed for moderate pain.        Follow-up Information    [facility], [place]. Schedule an appointment as soon as possible for a visit in 2 week(s).   Specialty:  General Surgery Why:  for follow up at our general surgery clinic  Contact information: [address] [place] [address] [phone]          Signed: [facility] 11/01/2016, 9:03 AM Pager: [phone] Consults: [phone] Mon-Fri 7:00 am-4:30 pm Sat-Sun 7:00 am-11:30 am

## 2019-01-22 IMAGING — CT CT ABD-PELV W/ CM
2 of 4 series · 15 of 46 positions shown, 17 images · IV contrast (Omni 300)
Comparison: None.

CLINICAL DATA: Abdominal pain with vomiting

EXAM:
CT ABDOMEN AND PELVIS WITH CONTRAST
TECHNIQUE: Multidetector CT imaging of the abdomen and pelvis was performed
using the standard protocol following bolus administration of
intravenous contrast. Oral contrast was also administered.
CONTRAST:  100mL L0WIFX-VCC IOPAMIDOL (L0WIFX-VCC) INJECTION 61%

[Series 3: a/p w/ 5mm · axial · 0.60mm/px · z∈[+880,+1285]mm · 12 of 89 slices shown, 14 images]
[im 4/89  soft-tissue]
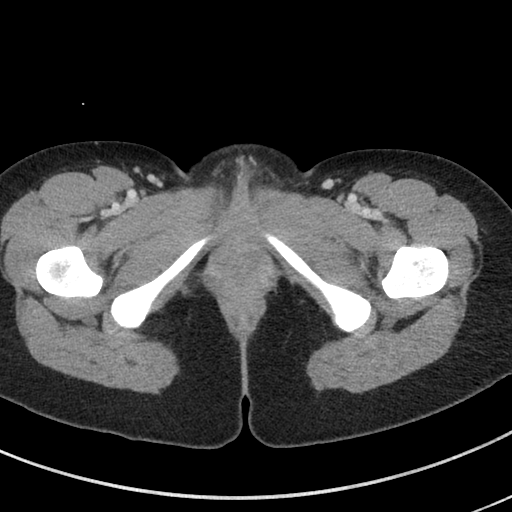
[im 4/89  bone]
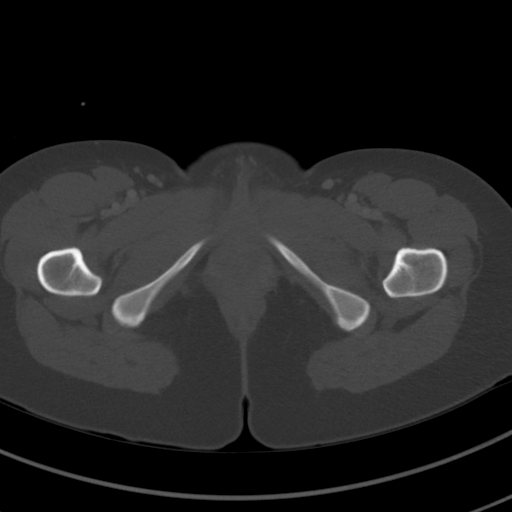
[im 12/89  soft-tissue]
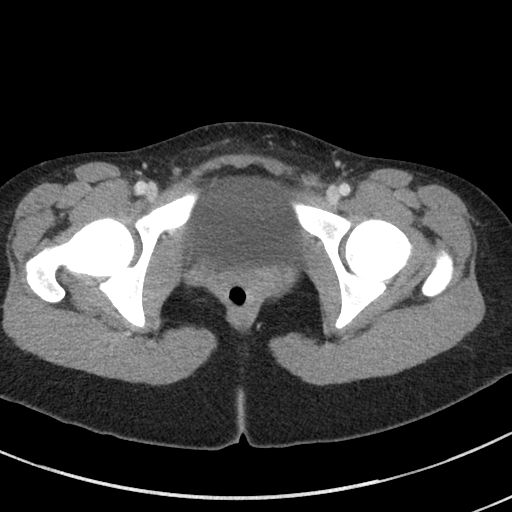
[im 19/89  soft-tissue]
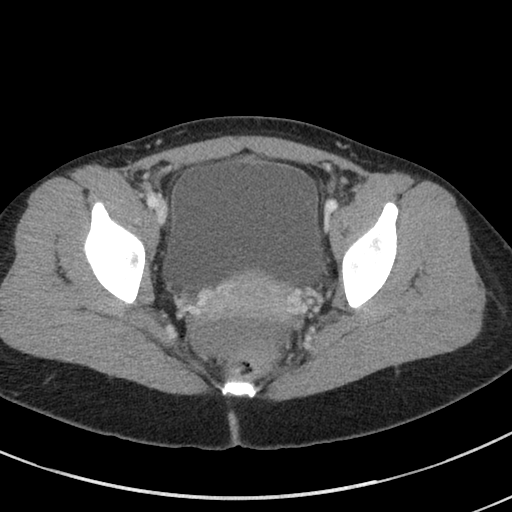
[im 26/89  soft-tissue]
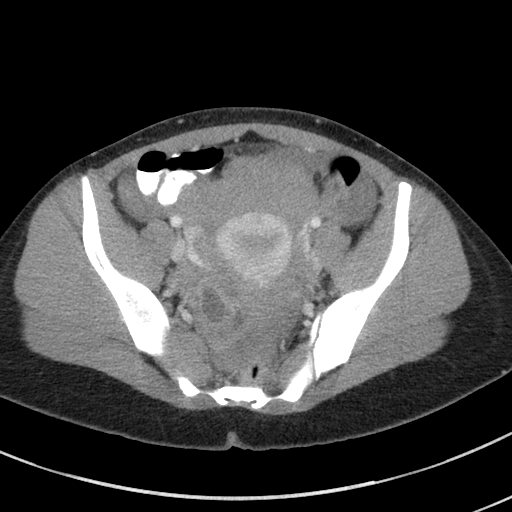
[im 34/89  soft-tissue]
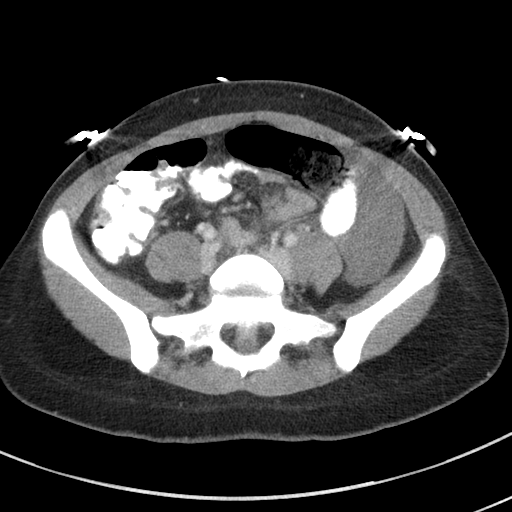
[im 41/89  soft-tissue]
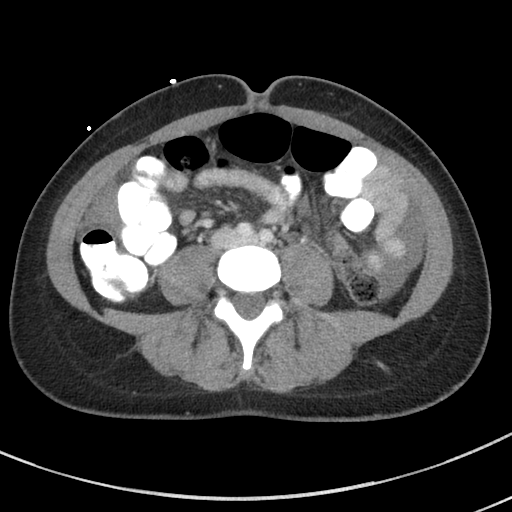
[im 48/89  soft-tissue]
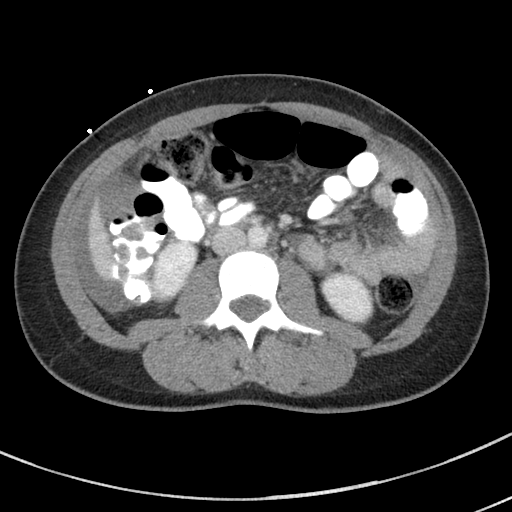
[im 56/89  soft-tissue]
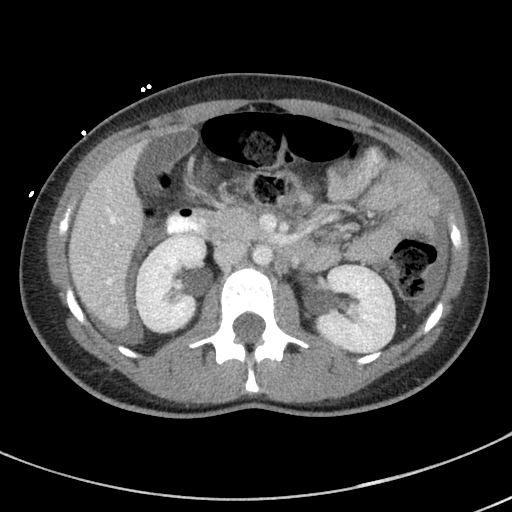
[im 63/89  soft-tissue]
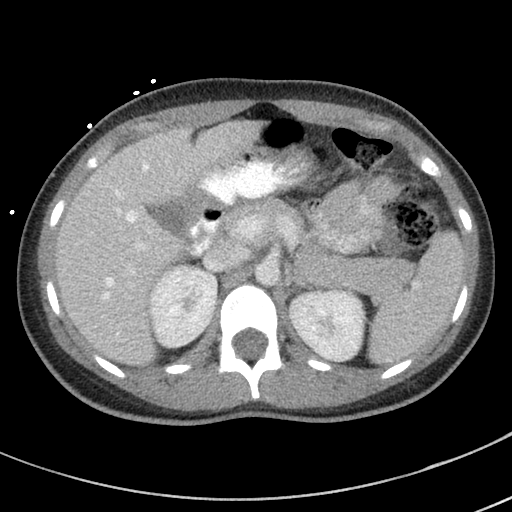
[im 63/89  bone]
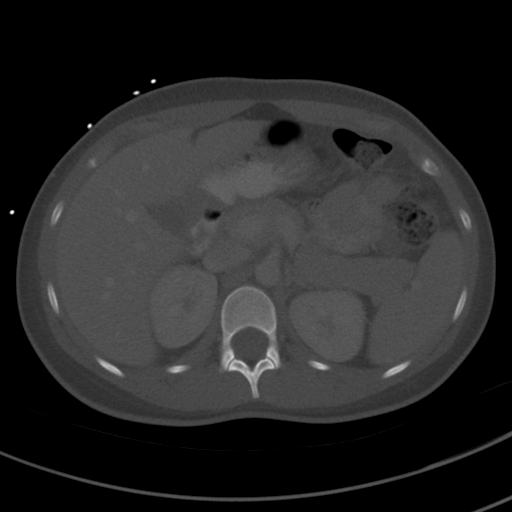
[im 70/89  soft-tissue]
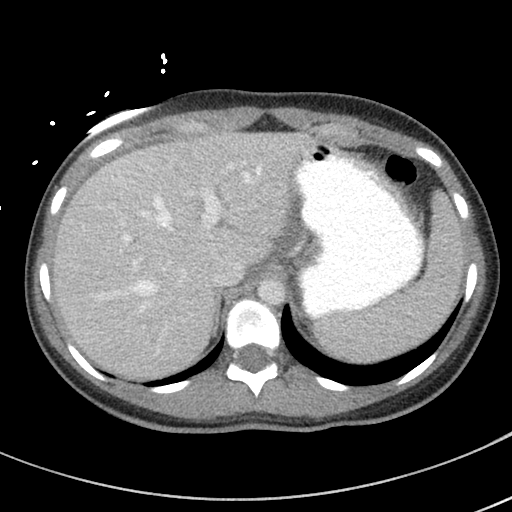
[im 78/89  soft-tissue]
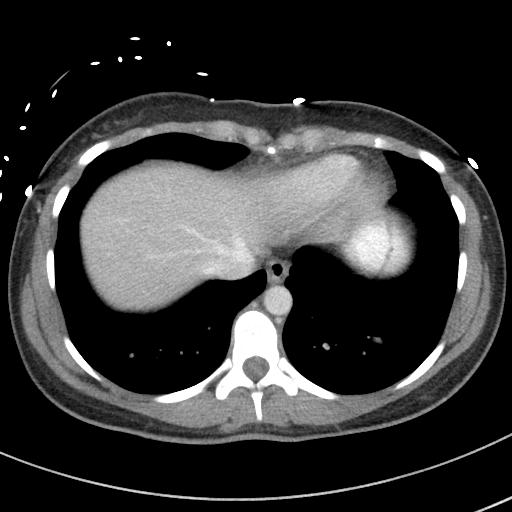
[im 85/89  soft-tissue]
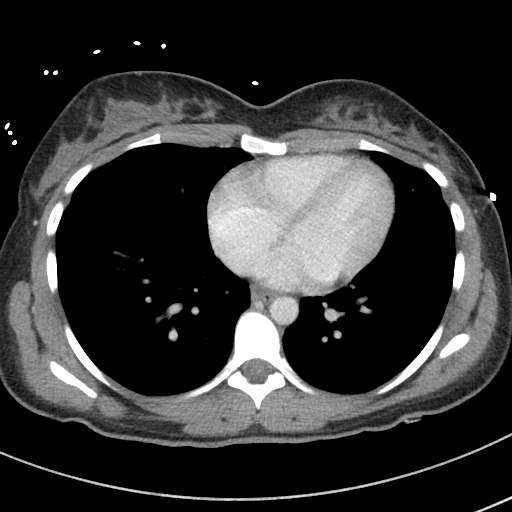

[Series 6: a/p w/ cor · coronal · 0.59mm/px · 3 of 145 slices shown]
[im 49/145  soft-tissue]
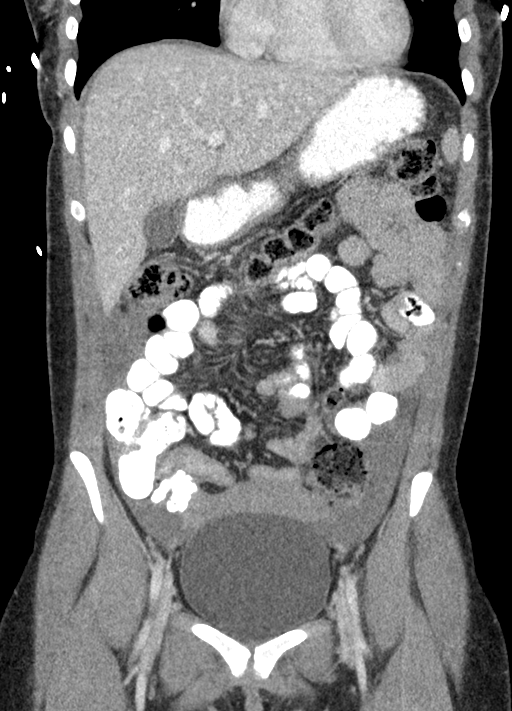
[im 65/145  soft-tissue]
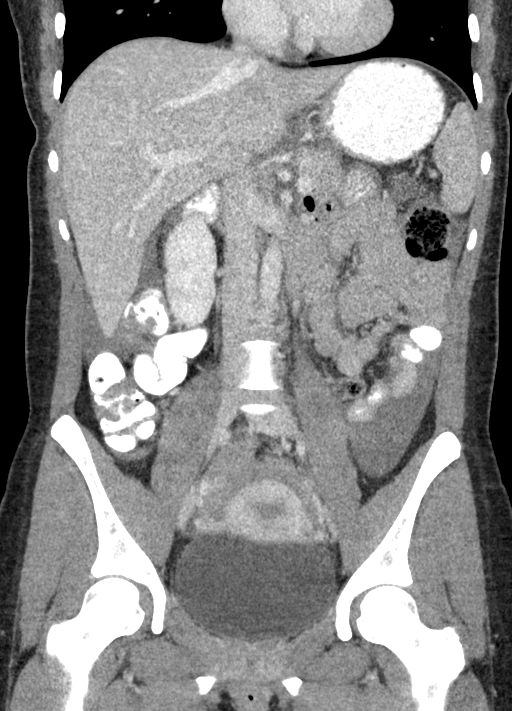
[im 81/145  soft-tissue]
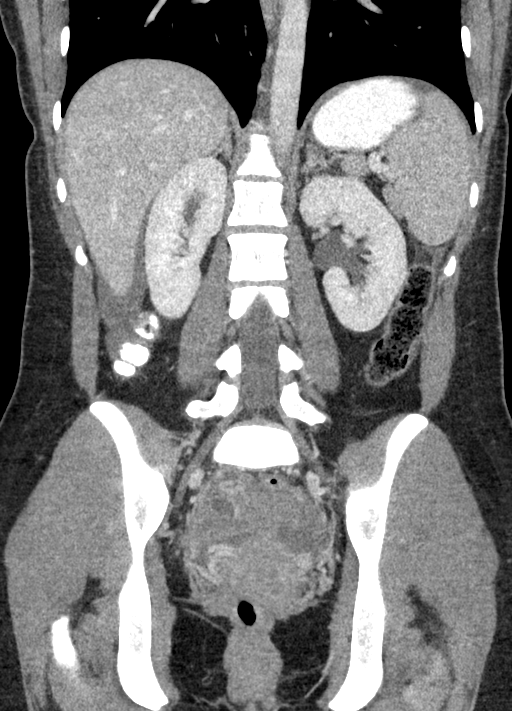

[15 of 46 positions shown; findings below may reference images not displayed]

FINDINGS: Lower chest: Lung bases are clear.

Hepatobiliary: There is a 7 mm cyst in the posterior segment of the
right lobe of the liver. No other focal liver lesions are evident.
Gallbladder wall is not appreciably thickened. There is no biliary
duct dilatation.

Pancreas: There is no pancreatic mass or inflammatory focus.

Spleen: No splenic lesions are evident.

Adrenals/Urinary Tract: Adrenals appear normal bilaterally. Kidneys
bilaterally show no mass or hydronephrosis on either side. There is
no renal or ureteral calculus on either side. Urinary bladder is
midline with wall thickness within normal limits.

Stomach/Bowel: There is no bowel wall or mesenteric thickening. No
bowel obstruction. No free air or portal venous air.

Vascular/Lymphatic: There is no abdominal aortic aneurysm. No
vascular lesions are evident. There are multiple lymph nodes in the
right abdomen, largest measuring 1.4 x 0.9 cm in size. Elsewhere,
there is no appreciable lymph node prominence.

Reproductive: Uterus is anteverted. There is a probable follicle in
the right ovary measuring 1.9 x 1.4 cm. No other pelvic masses
appreciable.

Other: There is diffuse wall thickening in the appendix consistent
with acute appendicitis. There is moderate ascites throughout the
abdomen and pelvis which raises question of appendiceal rupture. No
abscess or free air seen, however.

Musculoskeletal: There are no blastic or lytic bone lesions. There
are pars defects at L5 bilaterally without appreciable
spondylolisthesis. No intramuscular or abdominal wall lesions.
IMPRESSION: Findings indicative of acute appendicitis. Moderate ascites raises
suspicion for distal appendiceal rupture. No abscess or free air
seen.

Right ovary appears edematous. Question edema secondary to
inflammation from adjacent fluid, likely from the appendiceal
inflammation an apparent rupture. Ovarian torsion could present in
this manner but is felt to be less likely given the other findings.

No bowel obstruction. No abscess elsewhere in the abdomen or pelvis.

No renal or ureteral calculus.  No hydronephrosis.

Pars defects at L5 bilaterally without appreciable
spondylolisthesis.

Critical Value/emergent results were called by telephone at the time
of interpretation on 10/31/2016 at [DATE] to Dekyi Maconzy, PA, who
verbally acknowledged these results.
